# Patient Record
Sex: Male | Born: 1955 | Race: White | Hispanic: No | Marital: Married | Smoking: Never smoker
Health system: Southern US, Community
[De-identification: ages and names within clinical notes are randomized; demographics above are authoritative.]

## PROBLEM LIST (undated history)

## (undated) DIAGNOSIS — I10 Essential (primary) hypertension: Secondary | ICD-10-CM

---

## 2000-04-19 ENCOUNTER — Emergency Department (HOSPITAL_COMMUNITY): Admission: EM | Admit: 2000-04-19 | Discharge: 2000-04-19 | Payer: Self-pay | Admitting: Emergency Medicine

## 2000-04-19 ENCOUNTER — Encounter: Payer: Self-pay | Admitting: Emergency Medicine

## 2001-08-11 ENCOUNTER — Encounter: Payer: Self-pay | Admitting: Emergency Medicine

## 2001-08-11 ENCOUNTER — Emergency Department (HOSPITAL_COMMUNITY): Admission: EM | Admit: 2001-08-11 | Discharge: 2001-08-11 | Payer: Self-pay | Admitting: Emergency Medicine

## 2007-02-03 ENCOUNTER — Emergency Department (HOSPITAL_COMMUNITY): Admission: EM | Admit: 2007-02-03 | Discharge: 2007-02-04 | Payer: Self-pay | Admitting: Emergency Medicine

## 2008-06-26 IMAGING — CR DG RIBS W/ CHEST 3+V*R*
6 series · 6 of 6 positions shown · non-contrast
Comparison: none

CLINICAL DATA: Fall, pain.  
 RIGHT RIBS ? 5 VIEWS AND CHEST ? 1 VIEW:

[w ribs ap/pa upper right]
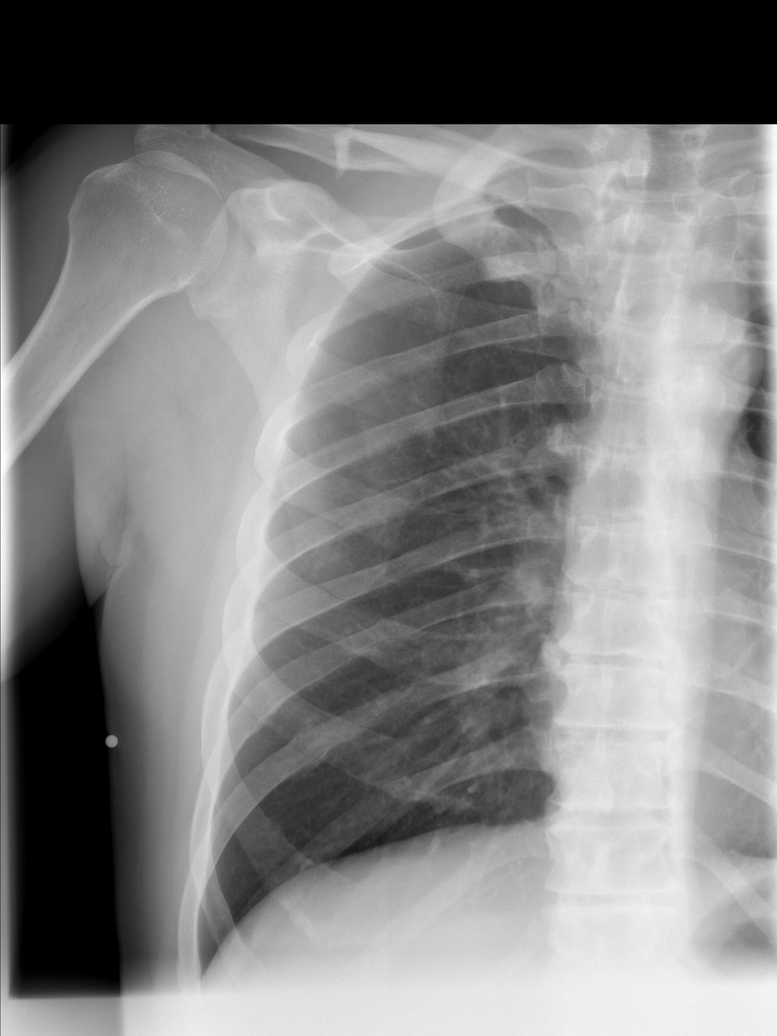

[w ribs ap/pa lower right]
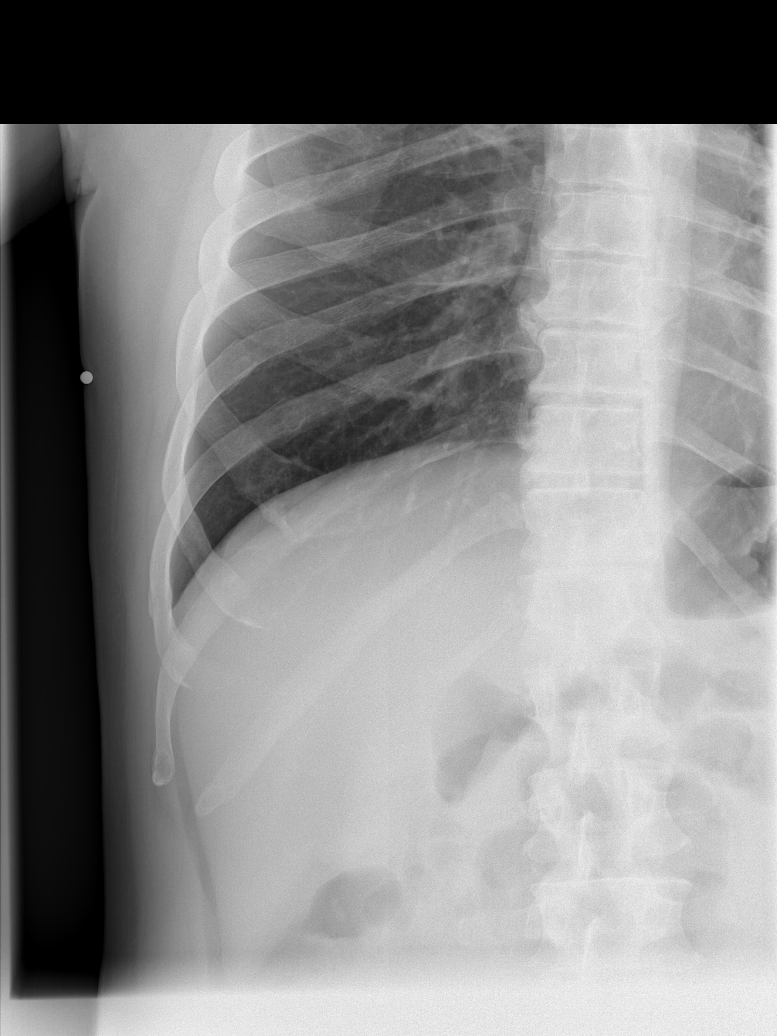

[w ribs oblique right (1 of 3)]
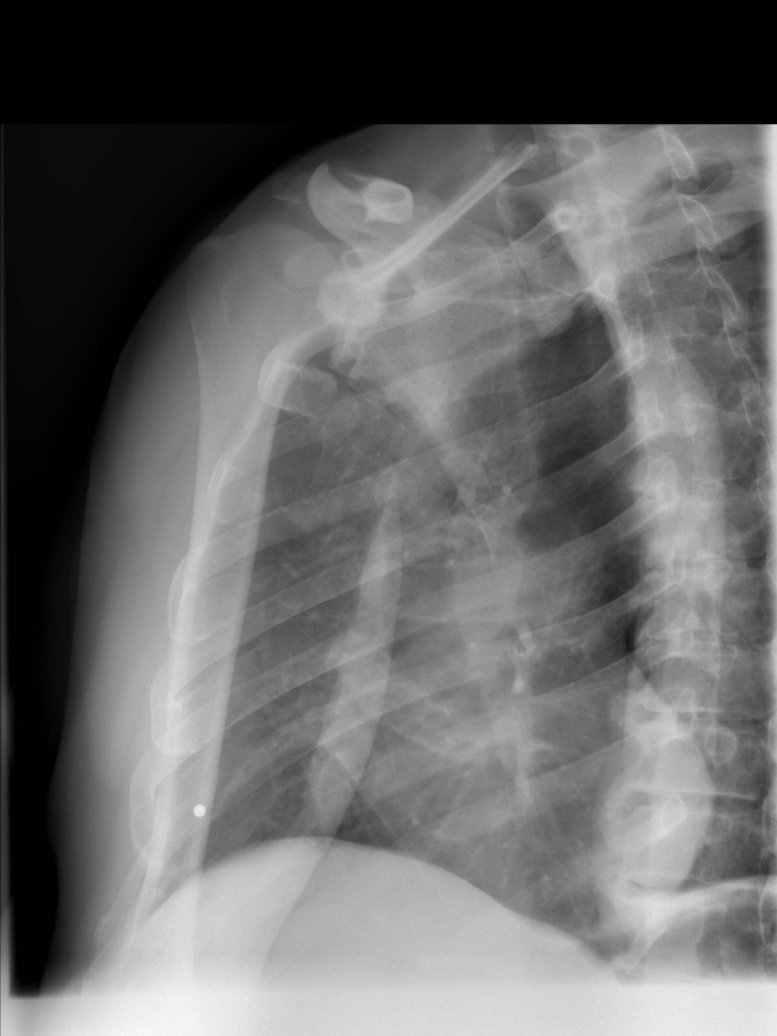

[w ribs oblique right (2 of 3)]
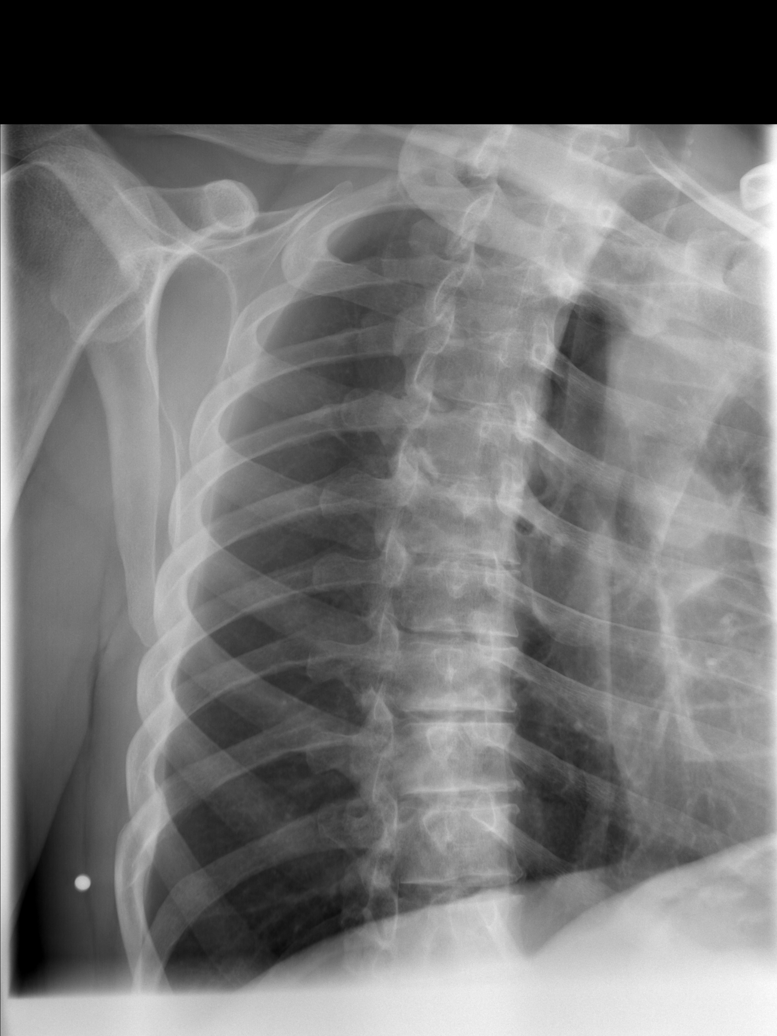

[w ribs oblique right (3 of 3)]
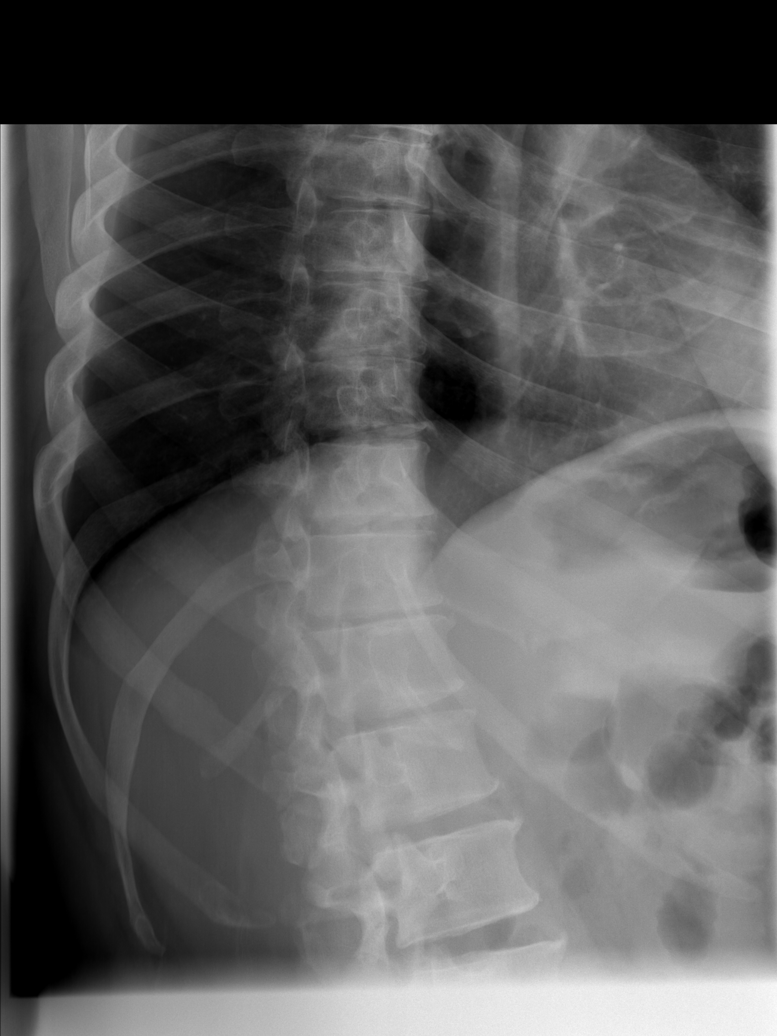

[w chest pa]
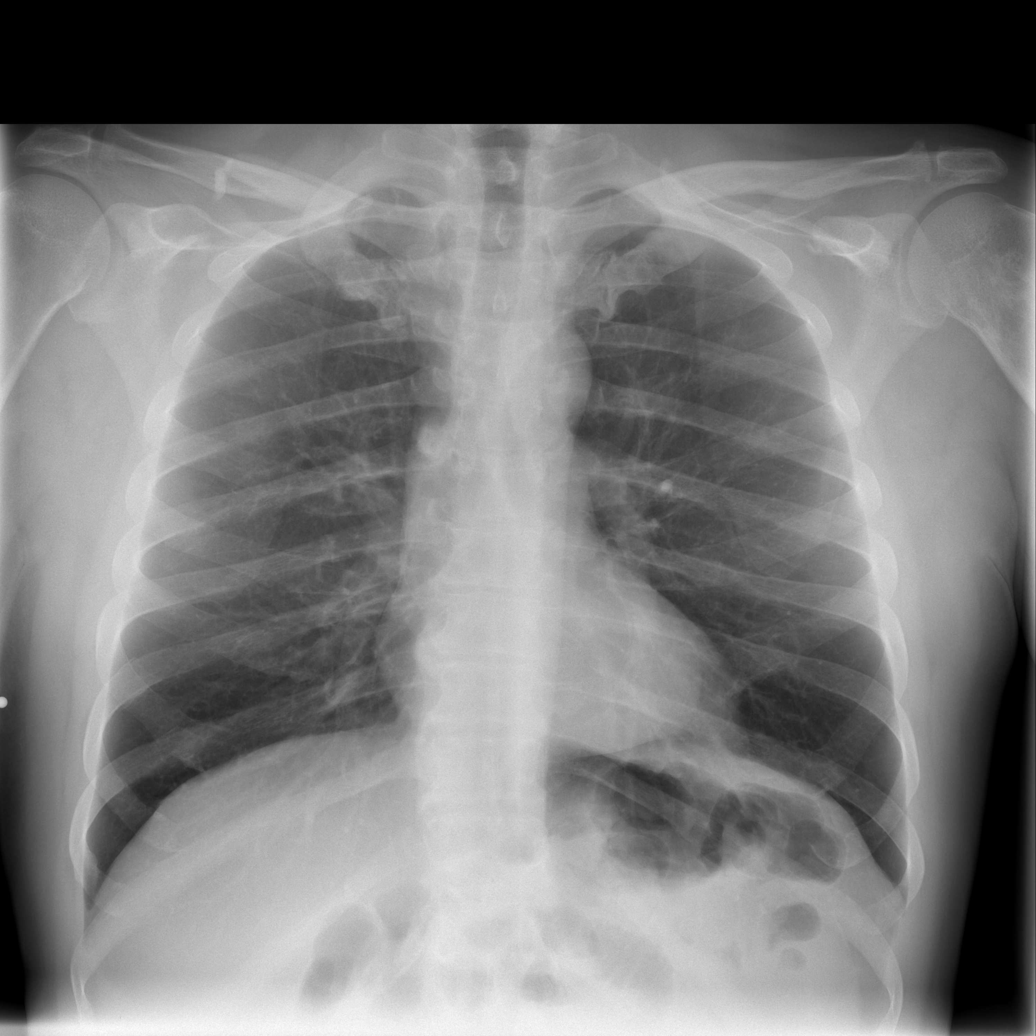

[6 of 6 positions shown; findings below may reference images not displayed]

FINDINGS: Fractures are seen in the posterior arc of the right eighth and ninth ribs with a second fracture along the lateral aspect of the right ninth rib.  Midshaft clavicle fracture is noted.  Lungs are clear.  Somewhat prominent nipple shadows are noted.  No effusion or pneumothorax.
IMPRESSION: 1.  Fractures of the right eighth and ninth ribs and right clavicle. 
 2.  No acute cardiopulmonary disease.

## 2009-02-26 ENCOUNTER — Emergency Department (HOSPITAL_COMMUNITY): Admission: EM | Admit: 2009-02-26 | Discharge: 2009-02-26 | Payer: Self-pay | Admitting: Emergency Medicine

## 2018-10-17 ENCOUNTER — Encounter (HOSPITAL_COMMUNITY): Payer: Self-pay | Admitting: *Deleted

## 2018-10-17 ENCOUNTER — Emergency Department (HOSPITAL_COMMUNITY)
Admission: EM | Admit: 2018-10-17 | Discharge: 2018-10-17 | Disposition: A | Discharge status: Home / Self Care | Payer: 59 | Attending: Emergency Medicine | Admitting: Emergency Medicine

## 2018-10-17 ENCOUNTER — Other Ambulatory Visit: Payer: Self-pay

## 2018-10-17 ENCOUNTER — Emergency Department (HOSPITAL_COMMUNITY): Payer: 59

## 2018-10-17 DIAGNOSIS — F419 Anxiety disorder, unspecified: Secondary | ICD-10-CM | POA: Diagnosis not present

## 2018-10-17 DIAGNOSIS — I10 Essential (primary) hypertension: Secondary | ICD-10-CM | POA: Diagnosis not present

## 2018-10-17 DIAGNOSIS — W208XXA Other cause of strike by thrown, projected or falling object, initial encounter: Secondary | ICD-10-CM | POA: Diagnosis not present

## 2018-10-17 DIAGNOSIS — Y999 Unspecified external cause status: Secondary | ICD-10-CM | POA: Insufficient documentation

## 2018-10-17 DIAGNOSIS — Y929 Unspecified place or not applicable: Secondary | ICD-10-CM | POA: Insufficient documentation

## 2018-10-17 DIAGNOSIS — S299XXA Unspecified injury of thorax, initial encounter: Secondary | ICD-10-CM | POA: Diagnosis present

## 2018-10-17 DIAGNOSIS — Y9389 Activity, other specified: Secondary | ICD-10-CM | POA: Insufficient documentation

## 2018-10-17 DIAGNOSIS — T148XXA Other injury of unspecified body region, initial encounter: Secondary | ICD-10-CM

## 2018-10-17 DIAGNOSIS — S2242XA Multiple fractures of ribs, left side, initial encounter for closed fracture: Secondary | ICD-10-CM

## 2018-10-17 DIAGNOSIS — S20312A Abrasion of left front wall of thorax, initial encounter: Secondary | ICD-10-CM | POA: Diagnosis not present

## 2018-10-17 HISTORY — DX: Essential (primary) hypertension: I10

## 2018-10-17 LAB — COMPREHENSIVE METABOLIC PANEL
ALT: 30 U/L (ref 0–44)
AST: 38 U/L (ref 15–41)
Albumin: 3.9 g/dL (ref 3.5–5.0)
Alkaline Phosphatase: 63 U/L (ref 38–126)
Anion gap: 9 (ref 5–15)
BUN: 15 mg/dL (ref 8–23)
CHLORIDE: 107 mmol/L (ref 98–111)
CO2: 20 mmol/L — ABNORMAL LOW (ref 22–32)
CREATININE: 0.94 mg/dL (ref 0.61–1.24)
Calcium: 8.5 mg/dL — ABNORMAL LOW (ref 8.9–10.3)
GFR calc Af Amer: >60 mL/min (ref >60)
GFR calc non Af Amer: >60 mL/min (ref >60)
Glucose, Bld: 110 mg/dL — ABNORMAL HIGH (ref 70–99)
POTASSIUM: 4 mmol/L (ref 3.5–5.1)
Sodium: 136 mmol/L (ref 135–145)
Total Bilirubin: 1.9 mg/dL — ABNORMAL HIGH (ref 0.3–1.2)
Total Protein: 7.3 g/dL (ref 6.5–8.1)

## 2018-10-17 LAB — CBC WITH DIFFERENTIAL/PLATELET
Abs Immature Granulocytes: 0.02 10*3/uL (ref 0.00–0.07)
BASOS PCT: 0 %
Basophils Absolute: 0 10*3/uL (ref 0.0–0.1)
Eosinophils Absolute: 0 10*3/uL (ref 0.0–0.5)
Eosinophils Relative: 0 %
HCT: 41.9 % (ref 39.0–52.0)
Hemoglobin: 14.4 g/dL (ref 13.0–17.0)
Immature Granulocytes: 0 %
Lymphocytes Relative: 15 %
Lymphs Abs: 1.3 10*3/uL (ref 0.7–4.0)
MCH: 34 pg (ref 26.0–34.0)
MCHC: 34.4 g/dL (ref 30.0–36.0)
MCV: 99.1 fL (ref 80.0–100.0)
Monocytes Absolute: 0.9 10*3/uL (ref 0.1–1.0)
Monocytes Relative: 10 %
Neutro Abs: 6.5 10*3/uL (ref 1.7–7.7)
Neutrophils Relative %: 75 %
Platelets: 168 10*3/uL (ref 150–400)
RBC: 4.23 MIL/uL (ref 4.22–5.81)
RDW: 12.6 % (ref 11.5–15.5)
WBC: 8.7 10*3/uL (ref 4.0–10.5)
nRBC: 0 % (ref 0.0–0.2)

## 2018-10-17 LAB — LIPASE, BLOOD: Lipase: 22 U/L (ref 11–51)

## 2018-10-17 MED ORDER — OXYCODONE-ACETAMINOPHEN 5-325 MG PO TABS
1.0000 | ORAL_TABLET | Freq: Once | ORAL | Status: AC
  Administered 2018-10-17: 1 via ORAL
  Filled 2018-10-17: qty 1

## 2018-10-17 MED ORDER — DIAZEPAM 5 MG PO TABS
5.0000 mg | ORAL_TABLET | Freq: Once | ORAL | Status: AC
  Administered 2018-10-17: 5 mg via ORAL
  Filled 2018-10-17: qty 1

## 2018-10-17 MED ORDER — METHOCARBAMOL 500 MG PO TABS: 500.0000 mg | ORAL_TABLET | Freq: Two times a day (BID) | ORAL | 0 refills | Status: AC

## 2018-10-17 MED ORDER — IOHEXOL 300 MG/ML  SOLN
100.0000 mL | Freq: Once | INTRAMUSCULAR | Status: AC | PRN
  Administered 2018-10-17: 100 mL via INTRAVENOUS

## 2018-10-17 MED ORDER — OXYCODONE-ACETAMINOPHEN 5-325 MG PO TABS: 2.0000 | ORAL_TABLET | ORAL | 0 refills | Status: AC | PRN

## 2018-10-17 NOTE — ED Provider Notes (Signed)
[facility] EMERGENCY DEPARTMENT Provider Note   CSN: [id number] Arrival date & time: 10/17/18  1123    History   Chief Complaint Chief Complaint  Patient presents with  . Chest Pain    HPI Patrick Lawrence is a 63 y.o. male who presents with chest and back pain.  Past medical history significant for hypertension.  He states that he was unloading a washer from a truck down a ramp and he fell backwards onto his back and the washer rolled onto his chest.  This occurred on Friday afternoon.  He states that he had severe pain at night and Saturday but was able to get out of bed.  Today he felt like his bones were shifting in his back and chest.  He took 800 mg of ibuprofen but got worried about the side effects so he does not want to take this anymore. It feels better when he lifts his arms over the head. Feels worse when he moves his shoulders forward. He has a wound over the right anterior chest which is tender.  He also has some pain over the sternum but states this is from an old fracture.  He is not on blood thinners.     HPI  Past Medical History:  Diagnosis Date  . Hypertension     There are no active problems to display for this patient.   History reviewed. No pertinent surgical history.      Home Medications    Prior to Admission medications   Not on File    Family History History reviewed. No pertinent family history.  Social History Social History   Tobacco Use  . Smoking status: Never Smoker  . Smokeless tobacco: Never Used  Substance Use Topics  . Alcohol use: Yes    Comment: social  . Drug use: Never     Allergies   Patient has no allergy information on record.   Review of Systems Review of Systems  Respiratory: Negative for shortness of breath.   Cardiovascular: Positive for chest pain.  Musculoskeletal: Positive for back pain.  Skin: Positive for wound.  Hematological: Does not bruise/bleed easily.  All other systems  reviewed and are negative.    Physical Exam Updated Vital Signs BP (!) 159/103 (BP Location: Left Arm)   Pulse 88   Temp 98.5 F (36.9 C) (Oral)   Resp 20   Ht  (1.753 m)   Wt 79.4 kg   SpO2 98%   BMI 25.84 kg/m   Physical Exam Vitals signs and nursing note reviewed.  Constitutional:      General: He is not in acute distress.    Appearance: He is well-developed. He is not ill-appearing.     Comments: Cooperative, mildly anxious  HENT:     Head: Normocephalic and atraumatic.  Eyes:     General: No scleral icterus.       Right eye: No discharge.        Left eye: No discharge.     Conjunctiva/sclera: Conjunctivae normal.     Pupils: Pupils are equal, round, and reactive to light.  Neck:     Musculoskeletal: Normal range of motion.     Comments: No midline tenderness Cardiovascular:     Rate and Rhythm: Normal rate and regular rhythm.  Pulmonary:     Effort: Pulmonary effort is normal. No respiratory distress.     Breath sounds: Normal breath sounds.     Comments: Large abrasion over the left anterior  chest wall Chest:     Chest wall: Tenderness (Tenderness over sternum, left upper chest wall with palpable defect over left upper chest wall) present.  Abdominal:     General: There is no distension.     Palpations: Abdomen is soft.     Tenderness: There is no abdominal tenderness.  Musculoskeletal:     Comments: Thoracic spinal tenderness. No lumbar tenderness  Skin:    General: Skin is warm and dry.  Neurological:     Mental Status: He is alert and oriented to person, place, and time.  Psychiatric:        Mood and Affect: Mood is anxious.        Behavior: Behavior normal.      ED Treatments / Results  Labs (all labs ordered are listed, but only abnormal results are displayed) Labs Reviewed  COMPREHENSIVE METABOLIC PANEL - Abnormal; Notable for the following components:      Result Value   CO2 20 (*)    Glucose, Bld 110 (*)    Calcium 8.5 (*)     Total Bilirubin 1.9 (*)    All other components within normal limits  CBC WITH DIFFERENTIAL/PLATELET  LIPASE, BLOOD    EKG EKG Interpretation  Date/Time:  Sunday October 17 2018 11:55:30 EDT Ventricular Rate:  81 PR Interval:  156 QRS Duration: 96 QT Interval:  372 QTC Calculation: 432 R Axis:   -22 Text Interpretation:  Normal sinus rhythm Incomplete right bundle branch block Borderline ECG No significant change since last tracing Confirmed by Linwood Dibbles [phone]) on 10/17/2018 11:57:30 AM   Radiology Dg Chest 2 View  Result Date: 10/17/2018 CLINICAL DATA:  Trauma, left chest injury, pain EXAM: CHEST - 2 VIEW COMPARISON:  02/03/2007 FINDINGS: The heart size and mediastinal contours are within normal limits. Both lungs are clear. The visualized skeletal structures are unremarkable. Diffuse thoracic spondylosis. Aorta atherosclerotic. Trachea midline. Nonobstructive bowel gas pattern. IMPRESSION: No active cardiopulmonary disease. Electronically Signed   By: Judie Petit.  Shick M.D.   On: 10/17/2018 13:22   Ct Chest W Contrast  Result Date: 10/17/2018 CLINICAL DATA:  Crushing injury EXAM: CT CHEST, ABDOMEN, AND PELVIS WITH CONTRAST TECHNIQUE: Multidetector CT imaging of the chest, abdomen and pelvis was performed following the standard protocol during bolus administration of intravenous contrast. CONTRAST:  OMNIPAQUE IOHEXOL 300 MG/ML  SOLN COMPARISON:  None. FINDINGS: CT CHEST FINDINGS Cardiovascular: No significant vascular findings. Normal heart size. No pericardial effusion. Mediastinum/Nodes: No enlarged mediastinal, hilar, or axillary lymph nodes. Thyroid gland, trachea, and esophagus demonstrate no significant findings. Lungs/Pleura: Trace left pleural effusion. Musculoskeletal: Minimally displaced fractures of the lateral left second through fifth ribs. No chest wall mass or suspicious bone lesions identified. CT ABDOMEN PELVIS FINDINGS Hepatobiliary: No focal liver abnormality is seen. No  gallstones, gallbladder wall thickening, or biliary dilatation. Pancreas: Unremarkable. No pancreatic ductal dilatation or surrounding inflammatory changes. Spleen: Normal in size without focal abnormality. Adrenals/Urinary Tract: Adrenal glands are unremarkable. Kidneys are normal, without renal calculi, focal lesion, or hydronephrosis. Bladder is unremarkable. Stomach/Bowel: Stomach is within normal limits. Appendix appears normal. No evidence of bowel wall thickening, distention, or inflammatory changes. Vascular/Lymphatic: Scattered calcific atherosclerosis. No enlarged abdominal or pelvic lymph nodes. Reproductive: No mass or other abnormality. Other: No abdominal wall hernia or abnormality. No abdominopelvic ascites. Musculoskeletal: No acute or significant osseous findings. IMPRESSION: 1. Minimally displaced fractures of the lateral left second through fifth ribs. 2. Trace left pleural effusion.  No significant pneumothorax. 3. No evidence  of traumatic injury to the organs of the chest, abdomen, or pelvis. Electronically Signed   By: Lauralyn Primes M.D.   On: 10/17/2018 16:44   Ct Abdomen Pelvis W Contrast  Result Date: 10/17/2018 CLINICAL DATA:  Crushing injury EXAM: CT CHEST, ABDOMEN, AND PELVIS WITH CONTRAST TECHNIQUE: Multidetector CT imaging of the chest, abdomen and pelvis was performed following the standard protocol during bolus administration of intravenous contrast. CONTRAST:  OMNIPAQUE IOHEXOL 300 MG/ML  SOLN COMPARISON:  None. FINDINGS: CT CHEST FINDINGS Cardiovascular: No significant vascular findings. Normal heart size. No pericardial effusion. Mediastinum/Nodes: No enlarged mediastinal, hilar, or axillary lymph nodes. Thyroid gland, trachea, and esophagus demonstrate no significant findings. Lungs/Pleura: Trace left pleural effusion. Musculoskeletal: Minimally displaced fractures of the lateral left second through fifth ribs. No chest wall mass or suspicious bone lesions identified. CT  ABDOMEN PELVIS FINDINGS Hepatobiliary: No focal liver abnormality is seen. No gallstones, gallbladder wall thickening, or biliary dilatation. Pancreas: Unremarkable. No pancreatic ductal dilatation or surrounding inflammatory changes. Spleen: Normal in size without focal abnormality. Adrenals/Urinary Tract: Adrenal glands are unremarkable. Kidneys are normal, without renal calculi, focal lesion, or hydronephrosis. Bladder is unremarkable. Stomach/Bowel: Stomach is within normal limits. Appendix appears normal. No evidence of bowel wall thickening, distention, or inflammatory changes. Vascular/Lymphatic: Scattered calcific atherosclerosis. No enlarged abdominal or pelvic lymph nodes. Reproductive: No mass or other abnormality. Other: No abdominal wall hernia or abnormality. No abdominopelvic ascites. Musculoskeletal: No acute or significant osseous findings. IMPRESSION: 1. Minimally displaced fractures of the lateral left second through fifth ribs. 2. Trace left pleural effusion.  No significant pneumothorax. 3. No evidence of traumatic injury to the organs of the chest, abdomen, or pelvis. Electronically Signed   By: Lauralyn Primes M.D.   On: 10/17/2018 16:44    Procedures Procedures (including critical care time)  Medications Ordered in ED Medications  diazepam (VALIUM) tablet 5 mg (5 mg Oral Given 10/17/18 1450)  oxyCODONE-acetaminophen (PERCOCET/ROXICET) 5-325 MG per tablet 1 tablet (1 tablet Oral Given 10/17/18 1450)  iohexol (OMNIPAQUE) 300 MG/ML solution 100 mL (100 mLs Intravenous Contrast Given 10/17/18 1559)     Initial Impression / Assessment and Plan / ED Course  I have reviewed the triage vital signs and the nursing notes.  Pertinent labs & imaging results that were available during my care of the patient were reviewed by me and considered in my medical decision making (see chart for details).  63 year old male presents with chest and back pain after a crushing injury and fall 2 days ago.   Vital signs are stable other than hypertension.  He has a large abrasion on his anterior chest and has palpable defect over the left upper chest wall although initial x-ray is negative.  He has some mild back tenderness. Shared visit with Dr. Penne Lash.  He is complaining of possible hematuria/orange urine  Will plan on CT imaging and pain control.  CT chest abdomen pelvis shows mildly displaced fractures of the left lateral 2nd-5th ribs.  Discussed with patient.  He is very appreciative of the results.  Labs are essentially normal.  UA was canceled as I do not feel like this will add to management of this problem.  Bilirubin is slightly elevated which may be the cause of his slightly orange urine.  He had adequate pain control with Percocet.  Will prescribe this and Robaxin and have him follow-up with his doctor.  He was given incentive spirometer as well.  Final Clinical Impressions(s) / ED Diagnoses   Final  diagnoses:  Closed fracture of multiple ribs of left side, initial encounter  Crush injury    ED Discharge Orders    None       Bethel Born, PA-C 10/17/18 1719    Shaune Pollack, MD 10/22/18 [phone]

## 2018-10-17 NOTE — ED Triage Notes (Signed)
PT reports washing machine fell on him while unloading it from truck. Pt reports machine pinned him to the ground. Pt has large abrasion and bruise to Lt chest.Pt reports he felt like the wind was k nocked out of him

## 2018-10-17 NOTE — ED Notes (Signed)
RT called/ notified 

## 2018-10-17 NOTE — Discharge Instructions (Addendum)
Use incentive spirometer several times a day to keep lungs healthy Take pain medicine as needed every 4-6 hours. Do not drink alcohol or drive while taking this medicine Take Robaxin (muscle relaxer) as needed for muscle pain and spasms Follow up with your doctor

## 2018-10-17 NOTE — ED Notes (Signed)
Pt given an incentive spirometer and demonstrated teach back. Acknowledges understanding of DC instructions.

## 2018-10-19 ENCOUNTER — Ambulatory Visit: Payer: 59 | Admitting: Family Medicine

## 2019-11-24 ENCOUNTER — Ambulatory Visit: Payer: 59 | Attending: Internal Medicine

## 2019-11-24 DIAGNOSIS — Z23 Encounter for immunization: Secondary | ICD-10-CM

## 2019-11-24 NOTE — Progress Notes (Signed)
   Covid-19 Vaccination Clinic  Name:  Lumir Demetriou    MRN: [id number] DOB: 03-Oct-1955  11/24/2019  Mr. Maines was observed post Covid-19 immunization for 15 minutes without incident. He was provided with Vaccine Information Sheet and instruction to access the V-Safe system.   Mr. Orlich was instructed to call 911 with any severe reactions post vaccine: Marland Kitchen Difficulty breathing  . Swelling of face and throat  . A fast heartbeat  . A bad rash all over body  . Dizziness and weakness   Immunizations Administered    Name Date Dose VIS Date Route   Pfizer COVID-19 Vaccine 11/24/2019  2:39 PM 0.3 mL 07/22/2019 Intramuscular   Manufacturer: [employer], [employer]   Lot: [id number]   NDC: [address]-4

## 2019-12-14 ENCOUNTER — Ambulatory Visit: Payer: 59 | Attending: Internal Medicine

## 2019-12-14 DIAGNOSIS — Z23 Encounter for immunization: Secondary | ICD-10-CM

## 2019-12-14 NOTE — Progress Notes (Signed)
   Covid-19 Vaccination Clinic  Name:  Mick Tanguma    MRN: [id number] DOB: Jul 05, 1956  12/14/2019  Mr. Bejar was observed post Covid-19 immunization for 15 minutes without incident. He was provided with Vaccine Information Sheet and instruction to access the V-Safe system.   Mr. Bisceglia was instructed to call 911 with any severe reactions post vaccine: Marland Kitchen Difficulty breathing  . Swelling of face and throat  . A fast heartbeat  . A bad rash all over body  . Dizziness and weakness   Immunizations Administered    Name Date Dose VIS Date Route   Pfizer COVID-19 Vaccine 12/14/2019  2:37 PM 0.3 mL 10/05/2018 Intramuscular   Manufacturer: [employer], [employer]   Lot: [id number]   NDC: [address]-4

## 2019-12-14 NOTE — Progress Notes (Signed)
   Covid-19 Vaccination Clinic  Name:  Patrick Lawrence    MRN: [id number] DOB: 09/19/1955  12/14/2019  Mr. Patrick Lawrence was observed post Covid-19 immunization for 15 minutes without incident. He was provided with Vaccine Information Sheet and instruction to access the V-Safe system.   Mr. Patrick Lawrence was instructed to call 911 with any severe reactions post vaccine: . Difficulty breathing  . Swelling of face and throat  . A fast heartbeat  . A bad rash all over body  . Dizziness and weakness   Immunizations Administered    Name Date Dose VIS Date Route   Pfizer COVID-19 Vaccine 12/14/2019  2:37 PM 0.3 mL 10/05/2018 Intramuscular   Manufacturer: Pfizer[employer] Inc   Lot: [id number]   NDC: [address]-2     

## 2020-03-09 IMAGING — CT CT ABD-PELV W/ CM
3 of 5 series · 14 of 36 positions shown, 17 images · IV contrast (omnipaque)
Comparison: None.

CLINICAL DATA: Crushing injury

EXAM:
CT CHEST, ABDOMEN, AND PELVIS WITH CONTRAST
TECHNIQUE: Multidetector CT imaging of the chest, abdomen and pelvis was
performed following the standard protocol during bolus
administration of intravenous contrast.
CONTRAST:  100mL OMNIPAQUE IOHEXOL 300 MG/ML  SOLN

[Series 7: cap with 5mm st · axial · 0.89mm/px · z∈[+816,+1366]mm · 9 of 138 slices shown, 12 images]
[im 14/138  mediastinal]
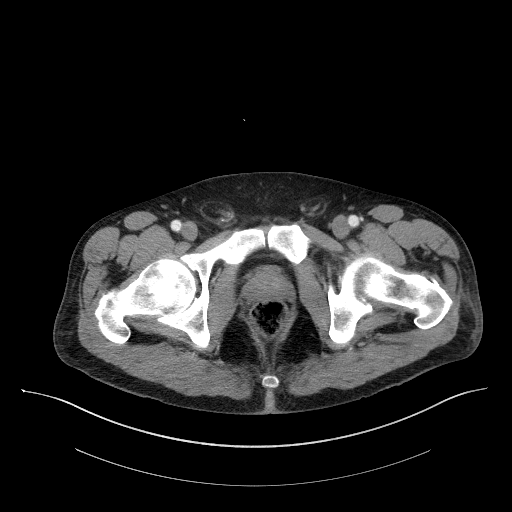
[im 14/138  lung]
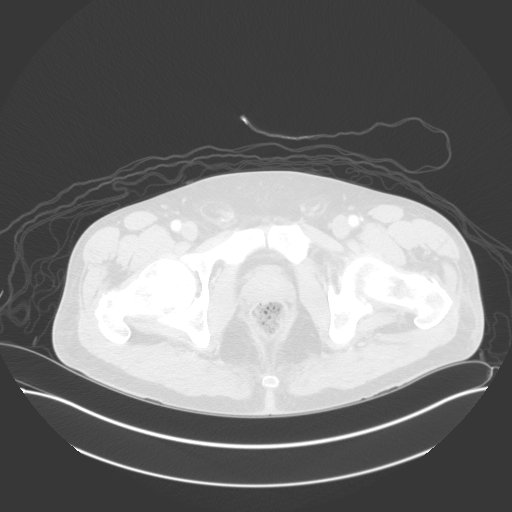
[im 28/138  lung]
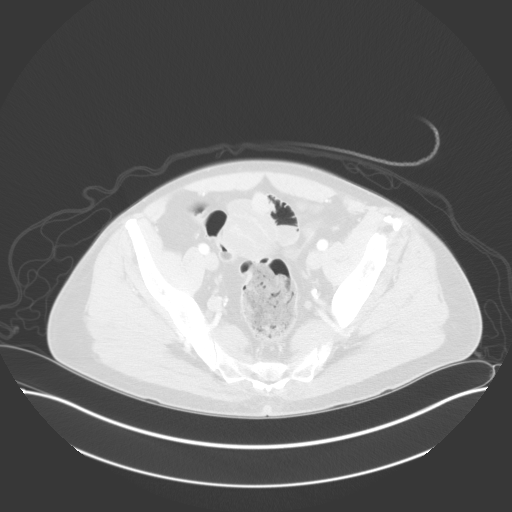
[im 42/138  lung]
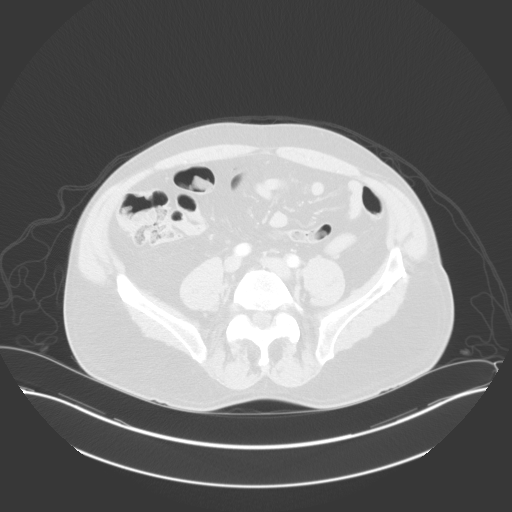
[im 55/138  lung]
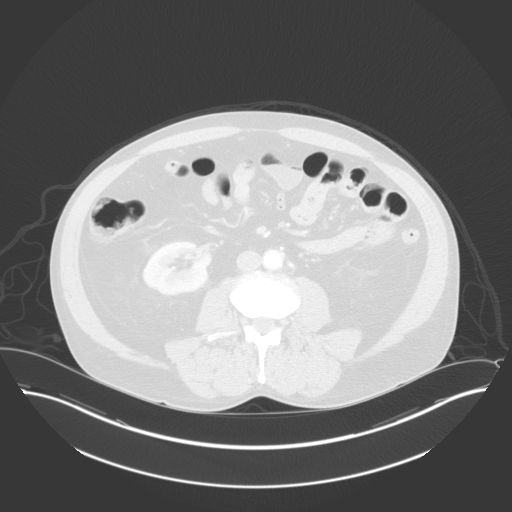
[im 69/138  mediastinal]
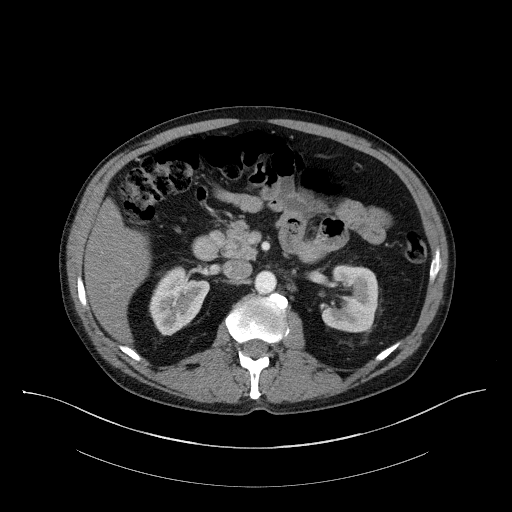
[im 69/138  lung]
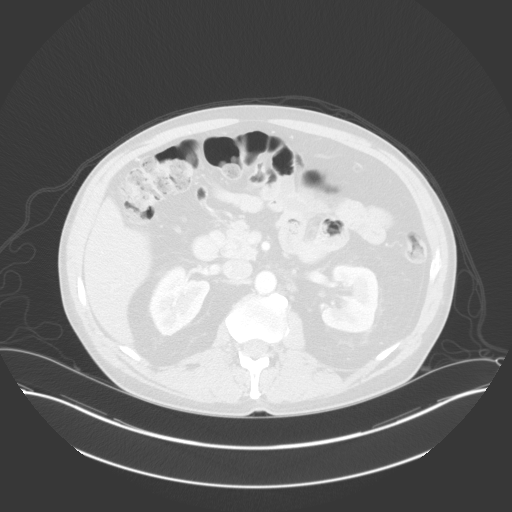
[im 83/138  lung]
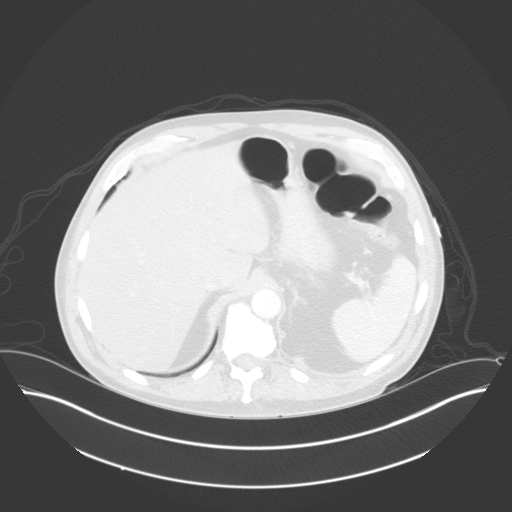
[im 96/138  lung]
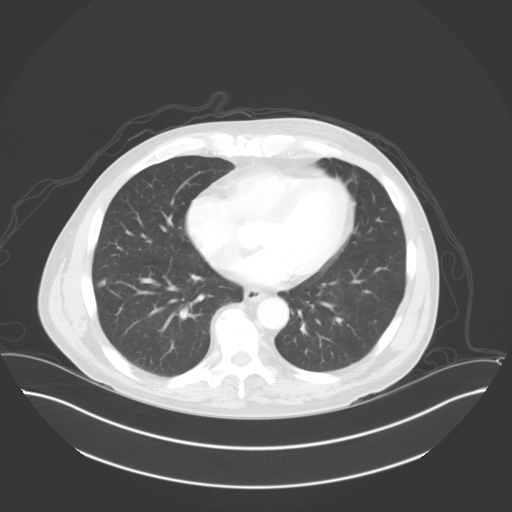
[im 110/138  lung]
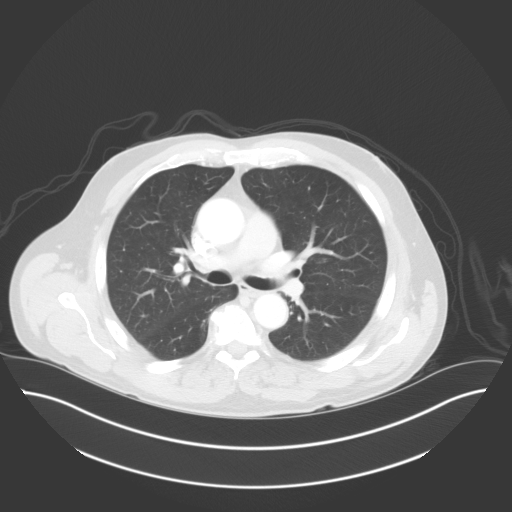
[im 124/138  mediastinal]
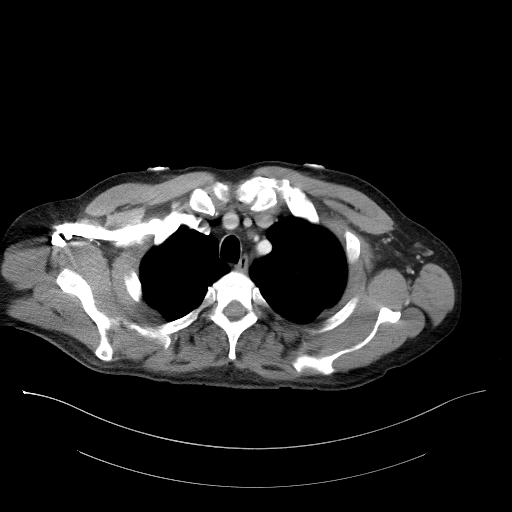
[im 124/138  lung]
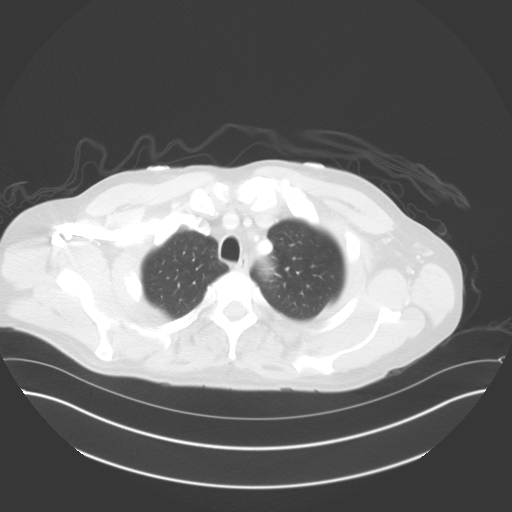

[Series 8: lung · axial · 0.87mm/px · z∈[+1132,+1182]mm · 2 of 165 slices shown]
[im 13/165  lung]
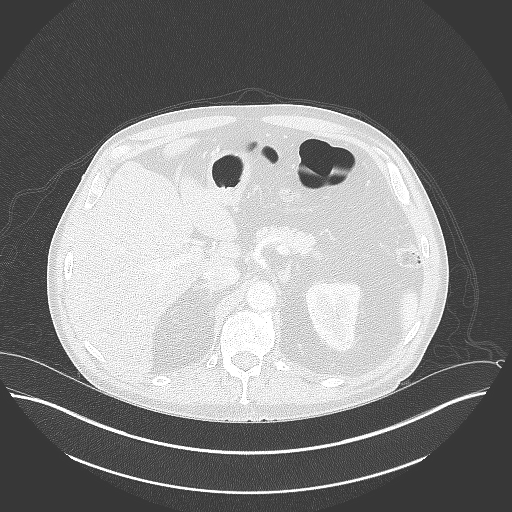
[im 38/165  lung]
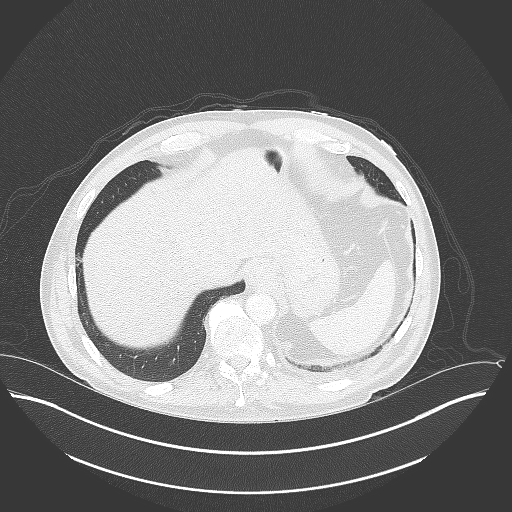

[Series 9: cap with 3mm st cor · coronal · 0.80mm/px · 3 of 191 slices shown]
[im 39/191  lung]
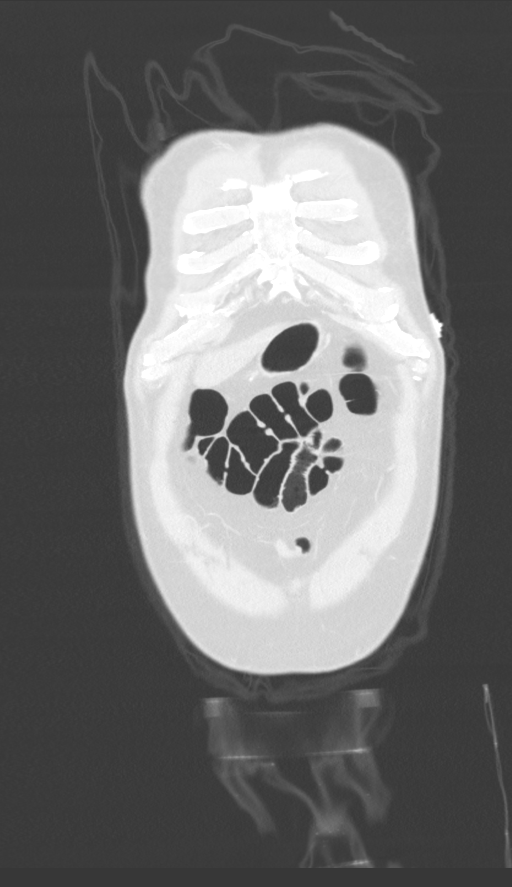
[im 77/191  lung]
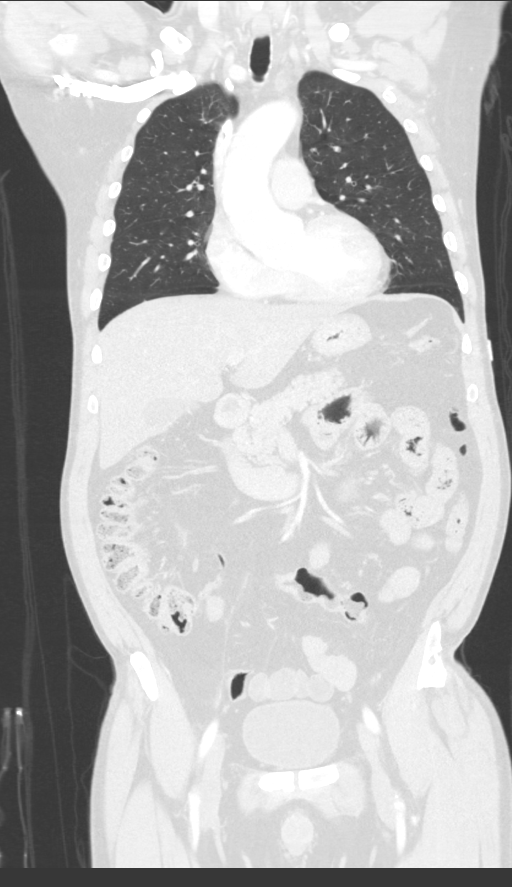
[im 115/191  lung]
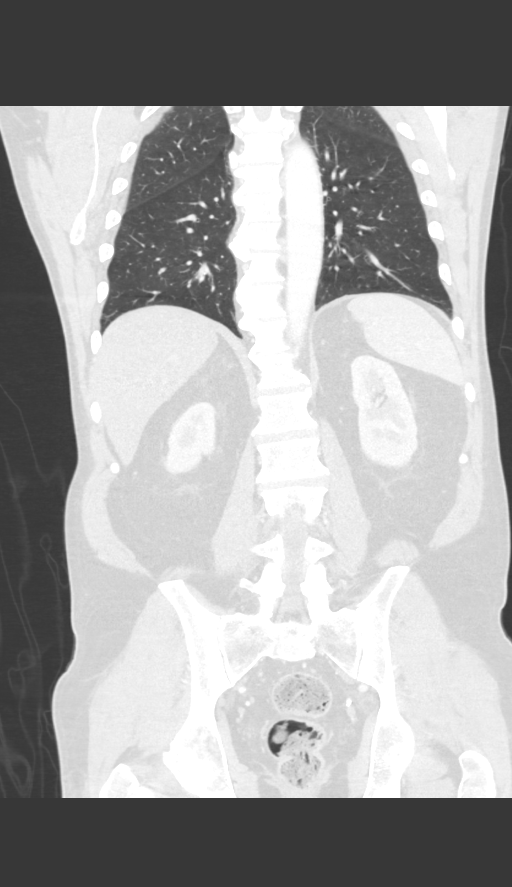

[14 of 36 positions shown; findings below may reference images not displayed]

FINDINGS: CT CHEST FINDINGS

Cardiovascular: No significant vascular findings. Normal heart size.
No pericardial effusion.

Mediastinum/Nodes: No enlarged mediastinal, hilar, or axillary lymph
nodes. Thyroid gland, trachea, and esophagus demonstrate no
significant findings.

Lungs/Pleura: Trace left pleural effusion.

Musculoskeletal: Minimally displaced fractures of the lateral left
second through fifth ribs. No chest wall mass or suspicious bone
lesions identified.

CT ABDOMEN PELVIS FINDINGS

Hepatobiliary: No focal liver abnormality is seen. No gallstones,
gallbladder wall thickening, or biliary dilatation.

Pancreas: Unremarkable. No pancreatic ductal dilatation or
surrounding inflammatory changes.

Spleen: Normal in size without focal abnormality.

Adrenals/Urinary Tract: Adrenal glands are unremarkable. Kidneys are
normal, without renal calculi, focal lesion, or hydronephrosis.
Bladder is unremarkable.

Stomach/Bowel: Stomach is within normal limits. Appendix appears
normal. No evidence of bowel wall thickening, distention, or
inflammatory changes.

Vascular/Lymphatic: Scattered calcific atherosclerosis. No enlarged
abdominal or pelvic lymph nodes.

Reproductive: No mass or other abnormality.

Other: No abdominal wall hernia or abnormality. No abdominopelvic
ascites.

Musculoskeletal: No acute or significant osseous findings.
IMPRESSION: 1. Minimally displaced fractures of the lateral left second through
fifth ribs.
2. Trace left pleural effusion.  No significant pneumothorax.
3. No evidence of traumatic injury to the organs of the chest,
abdomen, or pelvis.

## 2020-03-09 IMAGING — DX DG CHEST 2V
2 series · 2 of 2 positions shown · non-contrast
Comparison: 02/03/2007

CLINICAL DATA: Trauma, left chest injury, pain

EXAM:
CHEST - 2 VIEW

[chest pa]
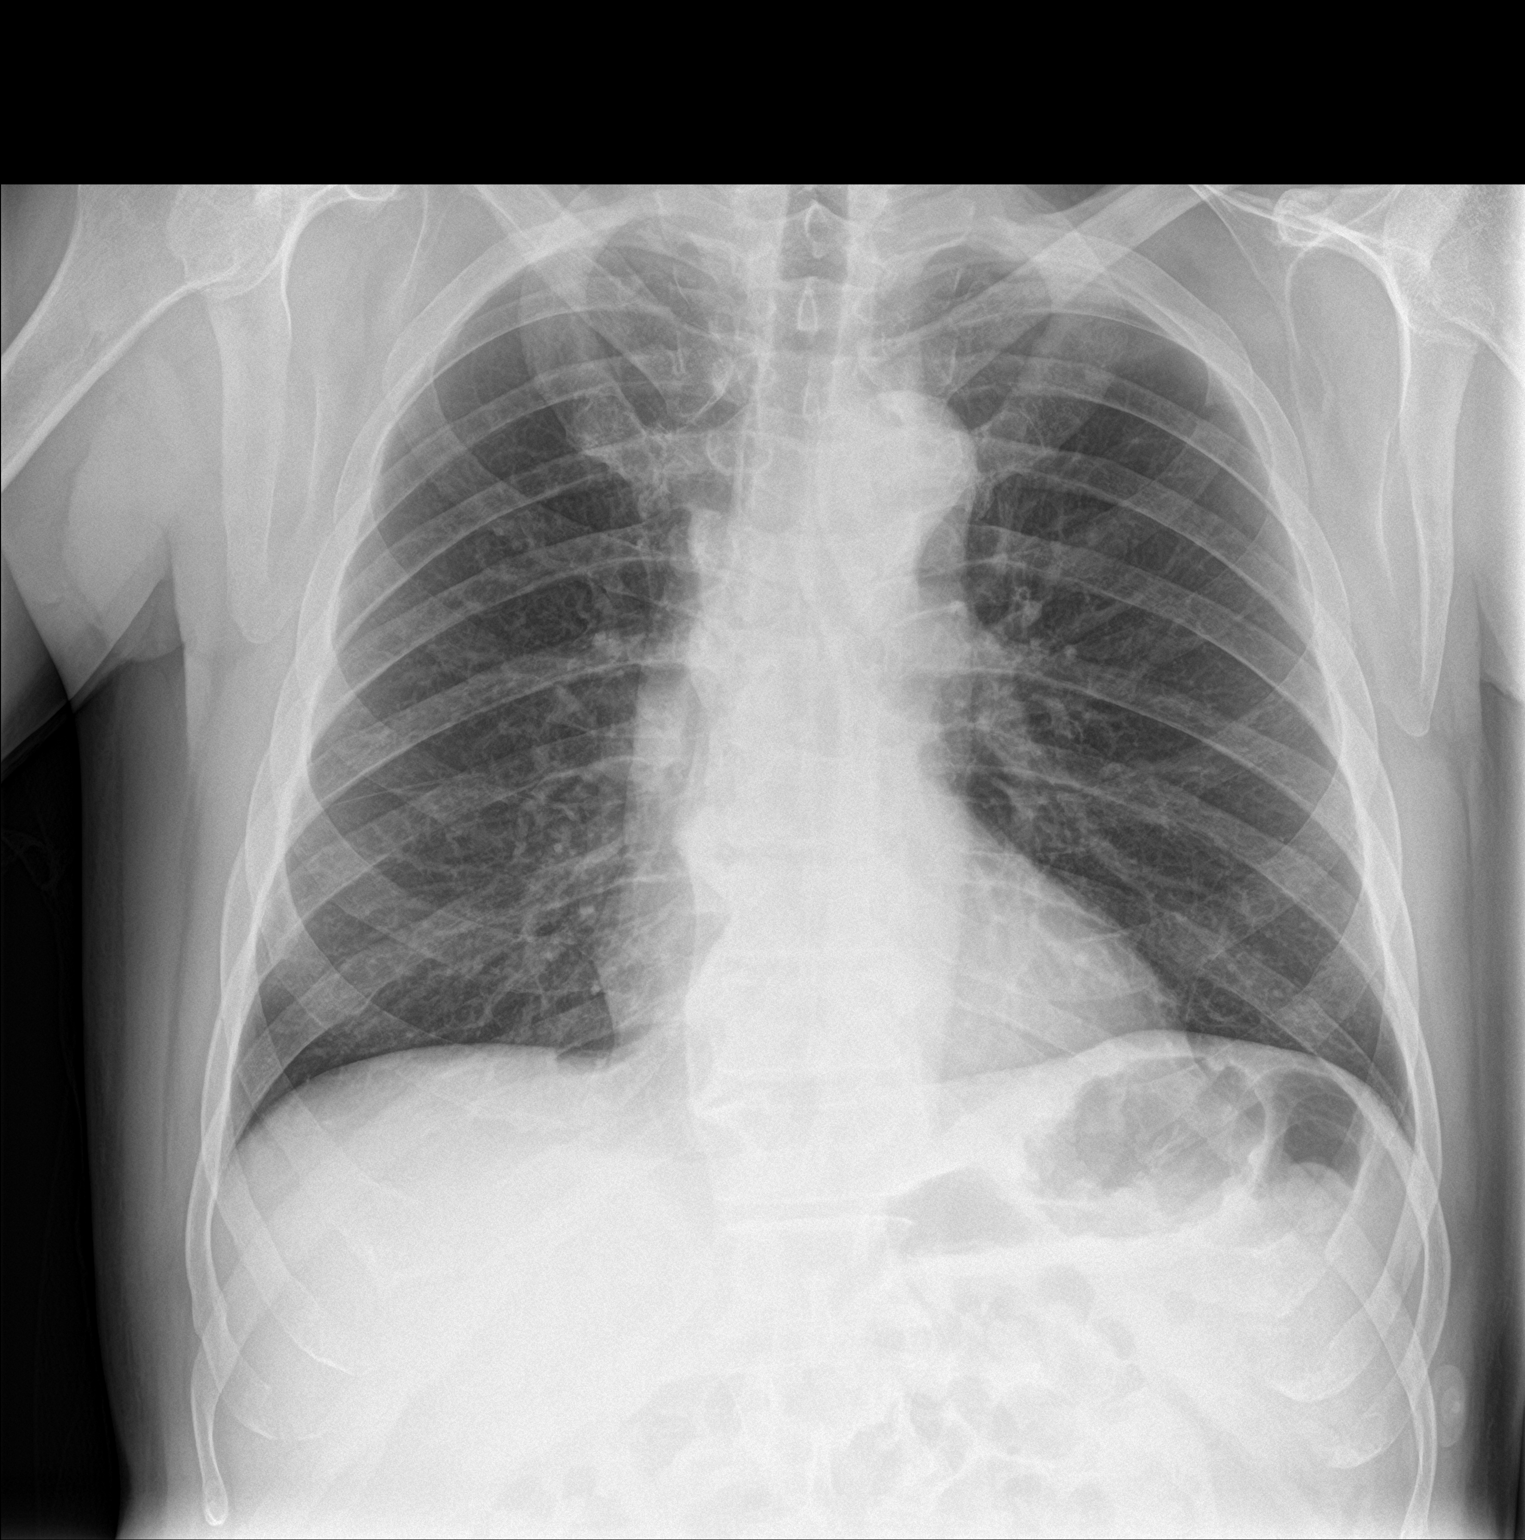

[chest lat]
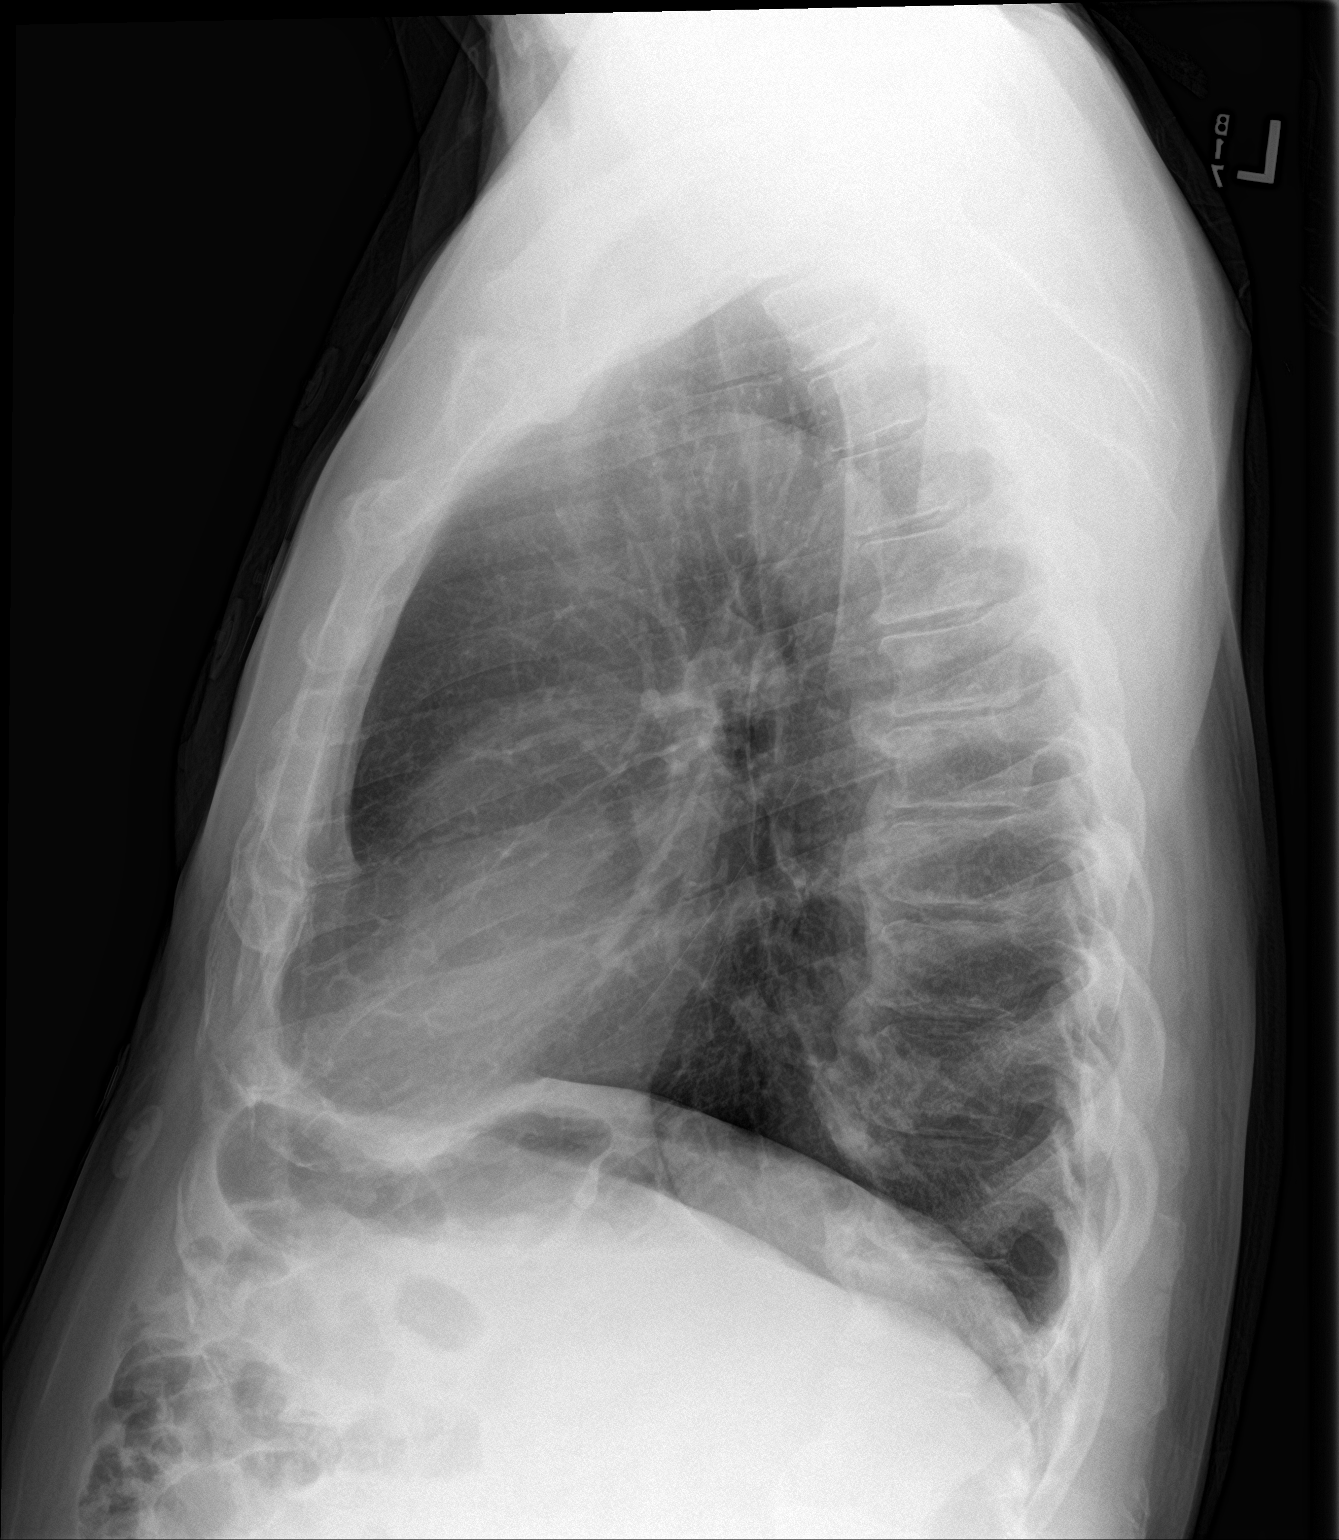

[2 of 2 positions shown; findings below may reference images not displayed]

FINDINGS: The heart size and mediastinal contours are within normal limits.
Both lungs are clear. The visualized skeletal structures are
unremarkable.

Diffuse thoracic spondylosis. Aorta atherosclerotic. Trachea
midline. Nonobstructive bowel gas pattern.
IMPRESSION: No active cardiopulmonary disease.
